# Patient Record
Sex: Female | Born: 2000 | Race: White | Hispanic: No | Marital: Single | Smoking: Never smoker
Health system: Southern US, Community
[De-identification: ages and names within clinical notes are randomized; demographics above are authoritative.]

---
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
## 2022-10-08 ENCOUNTER — Ambulatory Visit
Admission: EM | Admit: 2022-10-08 | Discharge: 2022-10-08 | Disposition: A | Discharge status: Home / Self Care | Payer: Managed Care, Other (non HMO) | Attending: Emergency Medicine | Admitting: Emergency Medicine

## 2022-10-08 DIAGNOSIS — H578A2 Foreign body sensation, left eye: Secondary | ICD-10-CM | POA: Diagnosis not present

## 2022-10-08 MED ORDER — ERYTHROMYCIN 5 MG/GM OP OINT: TOPICAL_OINTMENT | OPHTHALMIC | 0 refills | Status: DC

## 2022-10-08 NOTE — ED Triage Notes (Signed)
Pt states her contact ripped on the way this morning, pt states there is a piece stuck she has been unable to get out (LT eye)

## 2022-10-08 NOTE — ED Provider Notes (Signed)
[facility]    CSN: [id number] Arrival date & time: 10/08/22  1715      History   Chief Complaint Chief Complaint  Patient presents with   Foreign Body in Eye    LT eye     HPI Sarah Gonzalez is a 22 y.o. female.   Patient presents for evaluation of sensation of foreign body to the left eye.  Endorses that approximately overnight her contact ripped in half.  Began to experience irritation to the left eye and a sensation that contact was still present at approximately 4 PM.  Currently wearing glasses.  Eyes become red, denies drainage or pruritus. Has attempted to remove herself but was unsuccessful  History reviewed. No pertinent past medical history.  There are no problems to display for this patient.   History reviewed. No pertinent surgical history.  OB History   No obstetric history on file.      Home Medications    Prior to Admission medications   Medication Sig Start Date End Date Taking? Authorizing Provider  erythromycin ophthalmic ointment Place a 1/2 inch ribbon of ointment into the lower eyelid. 10/08/22  Yes Hans Eden, NP    Family History No family history on file.  Social History Social History   Tobacco Use   Smoking status: Never   Smokeless tobacco: Never  Vaping Use   Vaping Use: Never used  Substance Use Topics   Alcohol use: Never   Drug use: Never     Allergies   Patient has no allergy information on record.   Review of Systems Review of Systems Defer to hpi    Physical Exam Triage Vital Signs ED Triage Vitals  Enc Vitals Group     BP 10/08/22 1742 137/87     Pulse Rate 10/08/22 1742 94     Resp --      Temp 10/08/22 1742 99.5 F (37.5 C)     Temp Source 10/08/22 1742 Oral     SpO2 10/08/22 1742 99 %     Weight 10/08/22 1740 135 lb 11.2 oz (61.6 kg)     Height 10/08/22 1740 5' (1.524 m)     Head Circumference --      Peak Flow --      Pain Score 10/08/22 1740 2     Pain Loc --      Pain Edu?  --      Excl. in [place]? --    No data found.  Updated Vital Signs BP 137/87 (BP Location: Right Arm)   Pulse 94   Temp 99.5 F (37.5 C) (Oral)   Ht 5' (1.524 m)   Wt 135 lb 11.2 oz (61.6 kg)   SpO2 99%   BMI 26.50 kg/m   Visual Acuity Right Eye Distance:   Left Eye Distance:   Bilateral Distance:    Right Eye Near:   Left Eye Near:    Bilateral Near:     Physical Exam HENT:     Head: Normocephalic.  Eyes:     Comments: Erythema present to the sclera, vision is grossly intact, extraocular movements intact, no foreign body found  Pulmonary:     Effort: Pulmonary effort is normal.  Neurological:     Mental Status: She is alert and oriented to person, place, and time. Mental status is at baseline.      UC Treatments / Results  Labs (all labs ordered are listed, but only abnormal results are displayed) Labs Reviewed -  No data to display  EKG   Radiology No results found.  Procedures Procedures (including critical care time)  Medications Ordered in UC Medications - No data to display  Initial Impression / Assessment and Plan / UC Course  I have reviewed the triage vital signs and the nursing notes.  Pertinent labs & imaging results that were available during my care of the patient were reviewed by me and considered in my medical decision making (see chart for details).  Sensation of foreign body, left eye  examined by 2 providers, no foreign body present, most likely corneal abrasion due to contact, discussed with patient, erythromycin ointment prescribed prophylactically, advised against use of contacts until all symptoms have resolved discussed that symptoms typically resolve within 24 to 48 hours, given strict precautions that if no improvement seen within 72 hours she is to notify her ophthalmologist for further evaluation and treatment Final Clinical Impressions(s) / UC Diagnoses   Final diagnoses:  Foreign body sensation, left eye     Discharge  Instructions      Your eye was checked today by 2 providers and there is no contact present, contact most likely scratched the eye when ripped and that is the sensation that you are feeling,  Typically abrasions heal within 24 hours  May use topical erythromycin ointment twice a day for 7 days to prevent infection  Avoid use of your contacts until all symptoms have resolved  If your eye pain continues to worsen or you continue to feel sensation of foreign body please follow-up with your ophthalmologist for further evaluation   ED Prescriptions     Medication Sig Dispense Auth. Provider   erythromycin ophthalmic ointment Place a 1/2 inch ribbon of ointment into the lower eyelid. 3.5 g Hans Eden, NP      PDMP not reviewed this encounter.   Hans Eden, NP 10/08/22 1820

## 2022-10-08 NOTE — Discharge Instructions (Addendum)
Your eye was checked today by 2 providers and there is no contact present, contact most likely scratched the eye when ripped and that is the sensation that you are feeling,  Typically abrasions heal within 24 hours  May use topical erythromycin ointment twice a day for 7 days to prevent infection  Avoid use of your contacts until all symptoms have resolved  If your eye pain continues to worsen or you continue to feel sensation of foreign body please follow-up with your ophthalmologist for further evaluation

## 2023-06-04 ENCOUNTER — Encounter: Payer: Self-pay | Admitting: Emergency Medicine

## 2023-06-04 ENCOUNTER — Ambulatory Visit
Admission: EM | Admit: 2023-06-04 | Discharge: 2023-06-04 | Disposition: A | Discharge status: Home / Self Care | Payer: Managed Care, Other (non HMO) | Attending: Emergency Medicine | Admitting: Emergency Medicine

## 2023-06-04 DIAGNOSIS — N939 Abnormal uterine and vaginal bleeding, unspecified: Secondary | ICD-10-CM | POA: Insufficient documentation

## 2023-06-04 LAB — CBC WITH DIFFERENTIAL/PLATELET
Abs Immature Granulocytes: 0.02 10*3/uL (ref 0.00–0.07)
Basophils Absolute: 0.1 10*3/uL (ref 0.0–0.1)
Basophils Relative: 1 %
Eosinophils Absolute: 0 10*3/uL (ref 0.0–0.5)
Eosinophils Relative: 1 %
HCT: 44 % (ref 36.0–46.0)
Hemoglobin: 14.9 g/dL (ref 12.0–15.0)
Immature Granulocytes: 0 %
Lymphocytes Relative: 25 %
Lymphs Abs: 2 10*3/uL (ref 0.7–4.0)
MCH: 31 pg (ref 26.0–34.0)
MCHC: 33.9 g/dL (ref 30.0–36.0)
MCV: 91.5 fL (ref 80.0–100.0)
Monocytes Absolute: 0.4 10*3/uL (ref 0.1–1.0)
Monocytes Relative: 4 %
Neutro Abs: 5.5 10*3/uL (ref 1.7–7.7)
Neutrophils Relative %: 69 %
Platelets: 346 10*3/uL (ref 150–400)
RBC: 4.81 MIL/uL (ref 3.87–5.11)
RDW: 11.9 % (ref 11.5–15.5)
WBC: 8 10*3/uL (ref 4.0–10.5)
nRBC: 0 % (ref 0.0–0.2)

## 2023-06-04 LAB — PREGNANCY, URINE: Preg Test, Ur: NEGATIVE

## 2023-06-04 NOTE — ED Provider Notes (Signed)
HPI  SUBJECTIVE:  Sarah Gonzalez is a 22 y.o. female who presents with 2 episodes of vaginal bleeding this month.  Her last episode started 2 days ago.  Reports heavy bleeding, but states it is not bad as her usual menses.  She states that she is going through less than 1 pad per hour.  She reports crampy, intermittent, lower midline pain that lasts hours.  No nausea, vomiting, fevers, odorous discharge, other discharge, vaginal itching or rash.  She has not been sexually active in 2 months.  No back pain, urinary complaints, chest pain, shortness of breath, lightheadedness or dizziness.  She states that she spoke with her PCP, who instructed her to come to the urgent care for an ultrasound.  She has taken Aleve 440 mg with improvement in her symptoms.  No guarding factors.  Past medical history negative for anemia, STDs, BV, fibroids or coagulopathy.  LMP: She has been amenorrheic for the past 2 years since getting a Nexplanon 2-1/2 years ago.  She wonders if the Nexplanon is effectiveness is starting to wane.  PCP: [facility] [place]    History reviewed. No pertinent past medical history.  History reviewed. No pertinent surgical history.  History reviewed. No pertinent family history.  Social History   Tobacco Use   Smoking status: Never   Smokeless tobacco: Never  Vaping Use   Vaping status: Never Used  Substance Use Topics   Alcohol use: Not Currently   Drug use: Never    No current facility-administered medications for this encounter.  Current Outpatient Medications:    DULoxetine (CYMBALTA) 60 MG capsule, Take 1 capsule by mouth daily., Disp: , Rfl:    minocycline (MINOCIN) 100 MG capsule, Take by mouth., Disp: , Rfl:    tretinoin (RETIN-A) 0.025 % cream, Apply topically., Disp: , Rfl:    erythromycin ophthalmic ointment, Place a 1/2 inch ribbon of ointment into the lower eyelid., Disp: 3.5 g, Rfl: 0  No Known Allergies   ROS  As noted in HPI.   Physical  Exam  BP 130/84 (BP Location: Left Arm)   Pulse 70   Temp 98.8 F (37.1 C) (Oral)   SpO2 100%   Constitutional: Well developed, well nourished, no acute distress Eyes:  EOMI, conjunctiva normal bilaterally HENT: Normocephalic, atraumatic,mucus membranes moist Respiratory: Normal inspiratory effort Cardiovascular: Normal rate GI: nondistended soft.  Mild suprapubic tenderness.  No other abdominal tenderness. Back: No CVAT skin: No rash, skin intact Musculoskeletal: no deformities Neurologic: Alert & oriented x 3, no focal neuro deficits Psychiatric: Speech and behavior appropriate   ED Course   Medications - No data to display  Orders Placed This Encounter  Procedures   Pregnancy, urine    Standing Status:   Standing    Number of Occurrences:   1   CBC with Differential    Standing Status:   Standing    Number of Occurrences:   1    Results for orders placed or performed during the hospital encounter of 06/04/23 (from the past 24 hour(s))  Pregnancy, urine     Status: None   Collection Time: 06/04/23  3:43 PM  Result Value Ref Range   Preg Test, Ur NEGATIVE NEGATIVE  CBC with Differential     Status: None   Collection Time: 06/04/23  3:54 PM  Result Value Ref Range   WBC 8.0 4.0 - 10.5 K/uL   RBC 4.81 3.87 - 5.11 MIL/uL   Hemoglobin 14.9 12.0 - 15.0 g/dL  HCT 44.0 36.0 - 46.0 %   MCV 91.5 80.0 - 100.0 fL   MCH 31.0 26.0 - 34.0 pg   MCHC 33.9 30.0 - 36.0 g/dL   RDW 29.5 62.1 - 30.8 %   Platelets 346 150 - 400 K/uL   nRBC 0.0 0.0 - 0.2 %   Neutrophils Relative % 69 %   Neutro Abs 5.5 1.7 - 7.7 K/uL   Lymphocytes Relative 25 %   Lymphs Abs 2.0 0.7 - 4.0 K/uL   Monocytes Relative 4 %   Monocytes Absolute 0.4 0.1 - 1.0 K/uL   Eosinophils Relative 1 %   Eosinophils Absolute 0.0 0.0 - 0.5 K/uL   Basophils Relative 1 %   Basophils Absolute 0.1 0.0 - 0.1 K/uL   Immature Granulocytes 0 %   Abs Immature Granulocytes 0.02 0.00 - 0.07 K/uL   No results  found.  ED Clinical Impression  1. Abnormal vaginal bleeding      ED Assessment/Plan     Pregnancy test negative, hemoglobin normal at 14.9.  Doubt accompanying BV or STD, thus testing was not done.  Suspect Nexplanon is becoming less effective.  Advised her to follow-up with the provider who placed it so that she can have it removed and for her to consider other forms of birth control until she gets the new Nexplanon..    Discussed labs, MDM, treatment plan, and plan for follow-up with patient. Discussed sn/sx that should prompt return to the ED. patient agrees with plan.   No orders of the defined types were placed in this encounter.     *This clinic note was created using Dragon dictation software. Therefore, there may be occasional mistakes despite careful proofreading.  ?    Domenick Gong, MD 06/06/23 [phone]

## 2023-06-04 NOTE — ED Triage Notes (Signed)
Pt presents with vaginal bleeding and cramping x 2 days. Pt has a nexplanon for 3 years.

## 2023-06-04 NOTE — Discharge Instructions (Addendum)
Urine pregnancy is negative.  Your hemoglobin today is 14.9, which is excellent.  I would use backup birth control until you get a new Nexplanon.  Take 2 Naprosyn, and 1000 mg of Tylenol twice a day.  This will help slow down the bleeding and also with pain.

## 2024-01-22 ENCOUNTER — Emergency Department
Admission: EM | Admit: 2024-01-22 | Discharge: 2024-01-22 | Disposition: A | Discharge status: Home / Self Care | Attending: Emergency Medicine | Admitting: Emergency Medicine

## 2024-01-22 ENCOUNTER — Emergency Department

## 2024-01-22 ENCOUNTER — Other Ambulatory Visit: Payer: Self-pay

## 2024-01-22 DIAGNOSIS — S161XXA Strain of muscle, fascia and tendon at neck level, initial encounter: Secondary | ICD-10-CM | POA: Insufficient documentation

## 2024-01-22 DIAGNOSIS — S060X0A Concussion without loss of consciousness, initial encounter: Secondary | ICD-10-CM | POA: Diagnosis not present

## 2024-01-22 DIAGNOSIS — S0990XA Unspecified injury of head, initial encounter: Secondary | ICD-10-CM | POA: Diagnosis present

## 2024-01-22 DIAGNOSIS — Y92410 Unspecified street and highway as the place of occurrence of the external cause: Secondary | ICD-10-CM | POA: Insufficient documentation

## 2024-01-22 MED ORDER — METHOCARBAMOL 500 MG PO TABS: 500.0000 mg | ORAL_TABLET | Freq: Four times a day (QID) | ORAL | 0 refills | Status: DC

## 2024-01-22 MED ORDER — DIPHENHYDRAMINE HCL 25 MG PO CAPS
50.0000 mg | ORAL_CAPSULE | Freq: Once | ORAL | Status: AC
  Administered 2024-01-22: 50 mg via ORAL
  Filled 2024-01-22: qty 2

## 2024-01-22 MED ORDER — SUMATRIPTAN SUCCINATE 6 MG/0.5ML ~~LOC~~ SOLN
6.0000 mg | Freq: Once | SUBCUTANEOUS | Status: AC
  Administered 2024-01-22: 6 mg via SUBCUTANEOUS
  Filled 2024-01-22: qty 0.5

## 2024-01-22 MED ORDER — KETOROLAC TROMETHAMINE 30 MG/ML IJ SOLN
30.0000 mg | Freq: Once | INTRAMUSCULAR | Status: AC
  Administered 2024-01-22: 30 mg via INTRAMUSCULAR
  Filled 2024-01-22: qty 1

## 2024-01-22 MED ORDER — MELOXICAM 15 MG PO TABS: 15.0000 mg | ORAL_TABLET | Freq: Every day | ORAL | 0 refills | Status: DC

## 2024-01-22 MED ORDER — PROMETHAZINE HCL 25 MG/ML IJ SOLN
25.0000 mg | Freq: Once | INTRAMUSCULAR | Status: AC
  Administered 2024-01-22: 25 mg via INTRAMUSCULAR
  Filled 2024-01-22: qty 1
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
## 2024-08-11 ENCOUNTER — Ambulatory Visit
Admission: EM | Admit: 2024-08-11 | Discharge: 2024-08-11 | Disposition: A | Discharge status: Home / Self Care | Source: Home / Self Care

## 2024-08-11 DIAGNOSIS — S39012A Strain of muscle, fascia and tendon of lower back, initial encounter: Secondary | ICD-10-CM

## 2024-08-11 DIAGNOSIS — M545 Low back pain, unspecified: Secondary | ICD-10-CM

## 2024-08-11 MED ORDER — CYCLOBENZAPRINE HCL 10 MG PO TABS: 10.0000 mg | ORAL_TABLET | Freq: Three times a day (TID) | ORAL | 0 refills | Status: AC | PRN

## 2024-08-11 MED ORDER — NAPROXEN 500 MG PO TABS: 500.0000 mg | ORAL_TABLET | Freq: Two times a day (BID) | ORAL | 0 refills | Status: AC

## 2024-08-11 NOTE — ED Triage Notes (Signed)
 Patient states that she pulled a muscle at the gym. Right side bottom that radiates up her back and down her butt checks. 5 days

## 2024-08-11 NOTE — Discharge Instructions (Signed)
 BACK PAIN: Stressed avoiding painful activities . RICE (REST, ICE, COMPRESSION, ELEVATION) guidelines reviewed. May alternate ice and heat. Consider use of muscle rubs, Salonpas patches, etc. Use medications as directed including muscle relaxers if prescribed. Take anti-inflammatory medications as prescribed or OTC NSAIDs/Tylenol.  F/u with PCP in 7-10 days for reexamination, and please feel free to call or return to the urgent care at any time for any questions or concerns you may have and we will be happy to help you!   BACK PAIN RED FLAGS: If the back pain acutely worsens or there are any red flag symptoms such as numbness/tingling, leg weakness, saddle anesthesia, or loss of bowel/bladder control, go immediately to the ER. Follow up with [place] as scheduled or sooner if the pain does not begin to resolve or if it worsens before the follow up

## 2024-08-11 NOTE — ED Provider Notes (Signed)
 [facility]    CSN: [id number] Arrival date & time: 08/11/24  1050      History   Chief Complaint Chief Complaint  Patient presents with   Back Pain    HPI Tonyia Marschall is a 23 y.o. female with history of amplified muscle pain syndrome.  She presents today for diffuse lower back pain with radiation to the right buttocks and up the right back to just below the right shoulder blade for 5 days.  Patient reports she was on the rowing machine at [employer] on Friday and felt sharp immediate pain when she leaned forward.  Reports symptoms were getting better with Tylenol and rest but yesterday when she leaned over to give her dog some food she felt increased pain again.  Pain does not radiate further down the legs and no associated numbness, weakness or tingling.  No saddle anesthesia or loss of bowel or bladder control.  Has been using heating pad in addition to the Tylenol.  No other complaints.  HPI  History reviewed. No pertinent past medical history.  There are no active problems to display for this patient.   History reviewed. No pertinent surgical history.  OB History   No obstetric history on file.      Home Medications    Prior to Admission medications  Medication Sig Start Date End Date Taking? Authorizing Provider  cyclobenzaprine  (FLEXERIL ) 10 MG tablet Take 1 tablet (10 mg total) by mouth 3 (three) times daily as needed for muscle spasms. 08/11/24  Yes Arvis Jolan NOVAK, PA-C  DULoxetine (CYMBALTA) 60 MG capsule Take 1 capsule by mouth daily. 12/03/22  Yes [provider]  naproxen  (NAPROSYN ) 500 MG tablet Take 1 tablet (500 mg total) by mouth 2 (two) times daily. 08/11/24  Yes Arvis Jolan B, PA-C  minocycline (MINOCIN) 100 MG capsule Take by mouth. 02/04/23   [provider]    Family History History reviewed. No pertinent family history.  Social History Social History[1]   Allergies   Patient has no known  allergies.   Review of Systems Review of Systems  Gastrointestinal:  Negative for abdominal pain.  Genitourinary:  Negative for dysuria and frequency.  Musculoskeletal:  Positive for back pain. Negative for gait problem.  Neurological:  Negative for weakness and numbness.     Physical Exam Triage Vital Signs ED Triage Vitals  Encounter Vitals Group     BP      Girls Systolic BP Percentile      Girls Diastolic BP Percentile      Boys Systolic BP Percentile      Boys Diastolic BP Percentile      Pulse      Resp      Temp      Temp src      SpO2      Weight      Height      Head Circumference      Peak Flow      Pain Score      Pain Loc      Pain Education      Exclude from Growth Chart    No data found.  Updated Vital Signs BP 124/87 (BP Location: Right Arm)   Pulse 78   Temp 97.8 F (36.6 C) (Oral)   Resp 18   Wt 140 lb (63.5 kg)   SpO2 97%   BMI 27.34 kg/m      Physical Exam Vitals and nursing note reviewed.  Constitutional:      General: She is not in acute distress.    Appearance: Normal appearance. She is not ill-appearing or toxic-appearing.  HENT:     Head: Normocephalic and atraumatic.  Eyes:     General: No scleral icterus.       Right eye: No discharge.        Left eye: No discharge.     Conjunctiva/sclera: Conjunctivae normal.  Cardiovascular:     Rate and Rhythm: Normal rate and regular rhythm.     Heart sounds: Normal heart sounds.  Pulmonary:     Effort: Pulmonary effort is normal. No respiratory distress.     Breath sounds: Normal breath sounds.  Musculoskeletal:     Cervical back: Neck supple.     Lumbar back: No bony tenderness. Decreased range of motion. Negative right straight leg raise test and negative left straight leg raise test.     Comments: Generalized tenderness throughout bilateral paralumbar muscles, greater on the right side compared to left.  Also, tenderness of right buttocks.  Skin:    General: Skin is dry.   Neurological:     General: No focal deficit present.     Mental Status: She is alert. Mental status is at baseline.     Motor: No weakness.     Gait: Gait normal.  Psychiatric:        Mood and Affect: Mood normal.        Behavior: Behavior normal.      UC Treatments / Results  Labs (all labs ordered are listed, but only abnormal results are displayed) Labs Reviewed - No data to display  EKG   Radiology No results found.  Procedures Procedures (including critical care time)  Medications Ordered in UC Medications - No data to display  Initial Impression / Assessment and Plan / UC Course  I have reviewed the triage vital signs and the nursing notes.  Pertinent labs & imaging results that were available during my care of the patient were reviewed by me and considered in my medical decision making (see chart for details).   23 year old female with history of amplified muscle pain syndrome presents for lower back pain with radiation to the right buttocks x 5 days.  Symptoms started after she leaned forward on the rowing machine.  Has been taking Tylenol and using heating pad.  Current pain is about a 3-4 out of 10.  No saddle anesthesia or loss of bowel or bladder control.  Presentation consistent with lumbar strain versus pinched nerve or disc herniation.  Will treat at this time with naproxen  and cyclobenzaprine .  Encouraged stretching, practicing good body mechanics and lifting, heat, ice.  Reviewed return precautions.  Reviewed ED precautions.  Work note given.   Final Clinical Impressions(s) / UC Diagnoses   Final diagnoses:  Acute bilateral low back pain, unspecified whether sciatica present  Strain of lumbar region, initial encounter     Discharge Instructions      BACK PAIN: Stressed avoiding painful activities . RICE (REST, ICE, COMPRESSION, ELEVATION) guidelines reviewed. May alternate ice and heat. Consider use of muscle rubs, Salonpas patches, etc. Use  medications as directed including muscle relaxers if prescribed. Take anti-inflammatory medications as prescribed or OTC NSAIDs/Tylenol.  F/u with PCP in 7-10 days for reexamination, and please feel free to call or return to the urgent care at any time for any questions or concerns you may have and we will be happy to help you!   BACK PAIN  RED FLAGS: If the back pain acutely worsens or there are any red flag symptoms such as numbness/tingling, leg weakness, saddle anesthesia, or loss of bowel/bladder control, go immediately to the ER. Follow up with us  as scheduled or sooner if the pain does not begin to resolve or if it worsens before the follow up       ED Prescriptions     Medication Sig Dispense Auth. Provider   naproxen  (NAPROSYN ) 500 MG tablet Take 1 tablet (500 mg total) by mouth 2 (two) times daily. 30 tablet Arvis Huxley B, PA-C   cyclobenzaprine  (FLEXERIL ) 10 MG tablet Take 1 tablet (10 mg total) by mouth 3 (three) times daily as needed for muscle spasms. 20 tablet Arvis Huxley NOVAK, PA-C      I have reviewed the PDMP during this encounter.     [1]  Social History Tobacco Use   Smoking status: Never   Smokeless tobacco: Never  Vaping Use   Vaping status: Never Used  Substance Use Topics   Alcohol use: Not Currently   Drug use: Never     Arvis Huxley NOVAK, PA-C 08/11/24 1227
